# Patient Record
Sex: Male | Born: 1985 | Race: Asian | Hispanic: No | Marital: Single | Smoking: Never smoker
Health system: Southern US, Community
[De-identification: ages and names within clinical notes are randomized; demographics above are authoritative.]

---

## 2015-12-21 ENCOUNTER — Encounter: Payer: Self-pay | Admitting: Emergency Medicine

## 2015-12-21 ENCOUNTER — Emergency Department: Payer: BLUE CROSS/BLUE SHIELD

## 2015-12-21 ENCOUNTER — Emergency Department
Admission: EM | Admit: 2015-12-21 | Discharge: 2015-12-21 | Disposition: A | Discharge status: Home / Self Care | Payer: BLUE CROSS/BLUE SHIELD | Attending: Emergency Medicine | Admitting: Emergency Medicine

## 2015-12-21 DIAGNOSIS — Z23 Encounter for immunization: Secondary | ICD-10-CM | POA: Diagnosis not present

## 2015-12-21 DIAGNOSIS — Y92410 Unspecified street and highway as the place of occurrence of the external cause: Secondary | ICD-10-CM | POA: Insufficient documentation

## 2015-12-21 DIAGNOSIS — Y9389 Activity, other specified: Secondary | ICD-10-CM | POA: Diagnosis not present

## 2015-12-21 DIAGNOSIS — S61011A Laceration without foreign body of right thumb without damage to nail, initial encounter: Secondary | ICD-10-CM | POA: Diagnosis not present

## 2015-12-21 DIAGNOSIS — S80212A Abrasion, left knee, initial encounter: Secondary | ICD-10-CM | POA: Insufficient documentation

## 2015-12-21 DIAGNOSIS — S32020A Wedge compression fracture of second lumbar vertebra, initial encounter for closed fracture: Secondary | ICD-10-CM | POA: Diagnosis not present

## 2015-12-21 DIAGNOSIS — Y999 Unspecified external cause status: Secondary | ICD-10-CM | POA: Insufficient documentation

## 2015-12-21 DIAGNOSIS — S3992XA Unspecified injury of lower back, initial encounter: Secondary | ICD-10-CM | POA: Diagnosis present

## 2015-12-21 MED ORDER — TETANUS-DIPHTH-ACELL PERTUSSIS 5-2.5-18.5 LF-MCG/0.5 IM SUSP
0.5000 mL | Freq: Once | INTRAMUSCULAR | Status: AC
  Administered 2015-12-21: 0.5 mL via INTRAMUSCULAR
  Filled 2015-12-21: qty 0.5

## 2015-12-21 MED ORDER — LIDOCAINE HCL (PF) 1 % IJ SOLN
2.0000 mL | Freq: Once | INTRAMUSCULAR | Status: DC
Start: 2015-12-21 — End: 2015-12-21
  Filled 2015-12-21: qty 5

## 2015-12-21 MED ORDER — IBUPROFEN 800 MG PO TABS: 800.0000 mg | ORAL_TABLET | Freq: Three times a day (TID) | ORAL | 0 refills | Status: AC | PRN
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
## 2015-12-31 ENCOUNTER — Emergency Department
Admission: EM | Admit: 2015-12-31 | Discharge: 2015-12-31 | Disposition: A | Discharge status: Home / Self Care | Payer: BLUE CROSS/BLUE SHIELD | Attending: Emergency Medicine | Admitting: Emergency Medicine

## 2015-12-31 ENCOUNTER — Encounter: Payer: Self-pay | Admitting: Emergency Medicine

## 2015-12-31 DIAGNOSIS — Z791 Long term (current) use of non-steroidal anti-inflammatories (NSAID): Secondary | ICD-10-CM | POA: Insufficient documentation

## 2015-12-31 DIAGNOSIS — Z4802 Encounter for removal of sutures: Secondary | ICD-10-CM | POA: Diagnosis present
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
## 2015-12-31 NOTE — ED Triage Notes (Signed)
Here for suture removal from left thumb area
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
